# Patient Record
Sex: Male | Born: 1995 | Race: Black or African American | Hispanic: No | Marital: Single
Health system: Southern US, Community
[De-identification: ages and names within clinical notes are randomized; demographics above are authoritative.]

---
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
## 2019-10-01 ENCOUNTER — Ambulatory Visit: Payer: Self-pay

## 2019-11-10 ENCOUNTER — Ambulatory Visit: Payer: Self-pay | Attending: Internal Medicine

## 2019-11-10 DIAGNOSIS — Z23 Encounter for immunization: Secondary | ICD-10-CM

## 2019-11-10 NOTE — Progress Notes (Signed)
   [facility] Vaccination [facility]  Name:  Jaquis Picklesimer    MRN: [id number] DOB: 04-07-1996  11/10/2019  Mr. Legore was observed post Covid-19 immunization for 15 minutes without incident. He was provided with Vaccine Information Sheet and instruction to access the V-Safe system.   Mr. Hellums was instructed to call 911 with any severe reactions post vaccine: Marland Kitchen Difficulty breathing  . Swelling of face and throat  . A fast heartbeat  . A bad rash all over body  . Dizziness and weakness   Immunizations Administered    Name Date Dose VIS Date Route   Pfizer COVID-19 Vaccine 11/10/2019  1:02 PM 0.3 mL 08/17/2018 Intramuscular   Manufacturer: [employer], [employer]   Lot: [id number]   NDC: [address]-6

## 2019-12-05 ENCOUNTER — Ambulatory Visit: Payer: Self-pay | Attending: Internal Medicine

## 2019-12-05 DIAGNOSIS — Z23 Encounter for immunization: Secondary | ICD-10-CM

## 2019-12-05 NOTE — Progress Notes (Signed)
   Covid-[facility] Vaccination Clinic  Name:  Terrence Cline    MRN: [id number] DOB: 06/14/1996  12/05/2019  [name] was observed post Covid-19 immunization for 15 minutes without incident. He was provided with Vaccine Information Sheet and instruction to access the V-Safe system.   [name] was instructed to call 911 with any severe reactions post vaccine: Marland Kitchen Difficulty breathing  . Swelling of face and throat  . A fast heartbeat  . A bad rash all over body  . Dizziness and weakness   Immunizations Administered    Name Date Dose VIS Date Route   Pfizer COVID-19 Vaccine 12/05/2019  1:10 PM 0.3 mL 08/17/2018 Intramuscular   Manufacturer: [employer], [employer]   Lot: [id number]   NDC: [address]-7
